# Patient Record
Sex: Male | Born: 1980 | Race: Black or African American | Hispanic: No | Marital: Single | Smoking: Current every day smoker
Health system: Southern US, Community
[De-identification: ages and names within clinical notes are randomized; demographics above are authoritative.]

---

## 2009-02-07 ENCOUNTER — Ambulatory Visit: Payer: Self-pay | Admitting: Diagnostic Radiology

## 2009-02-07 ENCOUNTER — Emergency Department (HOSPITAL_BASED_OUTPATIENT_CLINIC_OR_DEPARTMENT_OTHER): Admission: EM | Admit: 2009-02-07 | Discharge: 2009-02-07 | Payer: Self-pay | Admitting: Emergency Medicine

## 2009-11-06 ENCOUNTER — Emergency Department (HOSPITAL_COMMUNITY): Admission: EM | Admit: 2009-11-06 | Discharge: 2009-11-06 | Payer: Self-pay | Admitting: Emergency Medicine

## 2010-04-05 LAB — POCT I-STAT, CHEM 8
BUN: 9 mg/dL (ref 6–23)
Calcium, Ion: 1.1 mmol/L — ABNORMAL LOW (ref 1.12–1.32)
Chloride: 107 mEq/L (ref 96–112)
Creatinine, Ser: 1.2 mg/dL (ref 0.4–1.5)
Glucose, Bld: 106 mg/dL — ABNORMAL HIGH (ref 70–99)
HCT: 53 % — ABNORMAL HIGH (ref 39.0–52.0)
Hemoglobin: 18 g/dL — ABNORMAL HIGH (ref 13.0–17.0)
Potassium: 4.1 mEq/L (ref 3.5–5.1)
Sodium: 142 mEq/L (ref 135–145)
TCO2: 24 mmol/L (ref 0–100)

## 2010-04-05 LAB — ETHANOL: Alcohol, Ethyl (B): 235 mg/dL — ABNORMAL HIGH (ref 0–10)
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
## 2013-04-19 ENCOUNTER — Encounter (HOSPITAL_BASED_OUTPATIENT_CLINIC_OR_DEPARTMENT_OTHER): Payer: Self-pay | Admitting: Emergency Medicine

## 2013-04-19 ENCOUNTER — Emergency Department (HOSPITAL_BASED_OUTPATIENT_CLINIC_OR_DEPARTMENT_OTHER): Payer: Self-pay

## 2013-04-19 ENCOUNTER — Emergency Department (HOSPITAL_BASED_OUTPATIENT_CLINIC_OR_DEPARTMENT_OTHER)
Admission: EM | Admit: 2013-04-19 | Discharge: 2013-04-19 | Disposition: A | Discharge status: Home / Self Care | Payer: Self-pay | Attending: Emergency Medicine | Admitting: Emergency Medicine

## 2013-04-19 DIAGNOSIS — S3981XA Other specified injuries of abdomen, initial encounter: Secondary | ICD-10-CM | POA: Insufficient documentation

## 2013-04-19 DIAGNOSIS — S0083XA Contusion of other part of head, initial encounter: Secondary | ICD-10-CM

## 2013-04-19 DIAGNOSIS — S20219A Contusion of unspecified front wall of thorax, initial encounter: Secondary | ICD-10-CM | POA: Insufficient documentation

## 2013-04-19 DIAGNOSIS — S1093XA Contusion of unspecified part of neck, initial encounter: Principal | ICD-10-CM

## 2013-04-19 DIAGNOSIS — S0990XA Unspecified injury of head, initial encounter: Secondary | ICD-10-CM | POA: Insufficient documentation

## 2013-04-19 DIAGNOSIS — S0003XA Contusion of scalp, initial encounter: Secondary | ICD-10-CM | POA: Insufficient documentation

## 2013-04-19 DIAGNOSIS — F17200 Nicotine dependence, unspecified, uncomplicated: Secondary | ICD-10-CM | POA: Insufficient documentation

## 2013-04-19 DIAGNOSIS — IMO0002 Reserved for concepts with insufficient information to code with codable children: Secondary | ICD-10-CM | POA: Insufficient documentation

## 2013-04-19 MED ORDER — HYDROCODONE-ACETAMINOPHEN 5-325 MG PO TABS: 1.0000 | ORAL_TABLET | ORAL | Status: AC | PRN
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
## 2013-04-19 NOTE — Discharge Instructions (Signed)
Blunt Chest Trauma Blunt chest trauma is an injury caused by a blow to the chest. These chest injuries can be very painful. Blunt chest trauma often results in bruised or broken (fractured) ribs. Most cases of bruised and fractured ribs from blunt chest traumas get better after 1 to 3 weeks of rest and pain medicine. Often, the soft tissue in the chest wall is also injured, causing pain and bruising. Internal organs, such as the heart and lungs, may also be injured. Blunt chest trauma can lead to serious medical problems. This injury requires immediate medical care. CAUSES   Motor vehicle collisions.  Falls.  Physical violence.  Sports injuries. SYMPTOMS   Chest pain. The pain may be worse when you move or breathe deeply.  Shortness of breath.  Lightheadedness.  Bruising.  Tenderness.  Swelling. DIAGNOSIS  Your caregiver will do a physical exam. X-rays may be taken to look for fractures. However, minor rib fractures may not show up on X-rays until a few days after the injury. If a more serious injury is suspected, further imaging tests may be done. This may include ultrasounds, computed tomography (CT) scans, or magnetic resonance imaging (MRI). TREATMENT  Treatment depends on the severity of your injury. Your caregiver may prescribe pain medicines and deep breathing exercises. HOME CARE INSTRUCTIONS  Limit your activities until you can move around without much pain.  Do not do any strenuous work until your injury is healed.  Put ice on the injured area.  Put ice in a plastic bag.  Place a towel between your skin and the bag.  Leave the ice on for 15-20 minutes, 03-04 times a day.  You may wear a rib belt as directed by your caregiver to reduce pain.  Practice deep breathing as directed by your caregiver to keep your lungs clear.  Only take over-the-counter or prescription medicines for pain, fever, or discomfort as directed by your caregiver. SEEK IMMEDIATE MEDICAL  CARE IF:   You have increasing pain or shortness of breath.  You cough up blood.  You have nausea, vomiting, or abdominal pain.  You have a fever.  You feel dizzy, weak, or you faint. MAKE SURE YOU:  Understand these instructions.  Will watch your condition.  Will get help right away if you are not doing well or get worse. Document Released: 02/16/2004 Document Revised: 04/02/2011 Document Reviewed: 10/25/2010 [facility]ExitCare Patient Information 2014 [place].  Contusion A contusion is a deep bruise. Contusions are the result of an injury that caused bleeding under the skin. The contusion may turn blue, purple, or yellow. Minor injuries will give you a painless contusion, but more severe contusions may stay painful and swollen for a few weeks.  CAUSES  A contusion is usually caused by a blow, trauma, or direct force to an area of the body. SYMPTOMS   Swelling and redness of the injured area.  Bruising of the injured area.  Tenderness and soreness of the injured area.  Pain. DIAGNOSIS  The diagnosis can be made by taking a history and physical exam. An X-ray, CT scan, or MRI may be needed to determine if there were any associated injuries, such as fractures. TREATMENT  Specific treatment will depend on what area of the body was injured. In general, the best treatment for a contusion is resting, icing, elevating, and applying cold compresses to the injured area. Over-the-counter medicines may also be recommended for pain control. Ask your caregiver what the best treatment is for your contusion. HOME  CARE INSTRUCTIONS   Put ice on the injured area.  Put ice in a plastic bag.  Place a towel between your skin and the bag.  Leave the ice on for 15-20 minutes, 03-04 times a day.  Only take over-the-counter or prescription medicines for pain, discomfort, or fever as directed by your caregiver. Your caregiver may recommend avoiding anti-inflammatory medicines (aspirin,  ibuprofen, and naproxen) for 48 hours because these medicines may increase bruising.  Rest the injured area.  If possible, elevate the injured area to reduce swelling. SEEK IMMEDIATE MEDICAL CARE IF:   You have increased bruising or swelling.  You have pain that is getting worse.  Your swelling or pain is not relieved with medicines. MAKE SURE YOU:   Understand these instructions.  Will watch your condition.  Will get help right away if you are not doing well or get worse. Document Released: 10/18/2004 Document Revised: 04/02/2011 Document Reviewed: 11/13/2010 [facility] Patient Information 2014 [place], [place].

## 2022-10-29 ENCOUNTER — Emergency Department (HOSPITAL_BASED_OUTPATIENT_CLINIC_OR_DEPARTMENT_OTHER)
Admission: EM | Admit: 2022-10-29 | Discharge: 2022-10-30 | Disposition: A | Discharge status: Home / Self Care | Payer: Self-pay | Attending: Emergency Medicine | Admitting: Emergency Medicine

## 2022-10-29 ENCOUNTER — Other Ambulatory Visit: Payer: Self-pay

## 2022-10-29 ENCOUNTER — Encounter (HOSPITAL_BASED_OUTPATIENT_CLINIC_OR_DEPARTMENT_OTHER): Payer: Self-pay

## 2022-10-29 ENCOUNTER — Emergency Department (HOSPITAL_BASED_OUTPATIENT_CLINIC_OR_DEPARTMENT_OTHER): Payer: Self-pay

## 2022-10-29 DIAGNOSIS — W19XXXA Unspecified fall, initial encounter: Secondary | ICD-10-CM

## 2022-10-29 DIAGNOSIS — R7989 Other specified abnormal findings of blood chemistry: Secondary | ICD-10-CM | POA: Insufficient documentation

## 2022-10-29 DIAGNOSIS — W010XXA Fall on same level from slipping, tripping and stumbling without subsequent striking against object, initial encounter: Secondary | ICD-10-CM | POA: Insufficient documentation

## 2022-10-29 DIAGNOSIS — S2242XA Multiple fractures of ribs, left side, initial encounter for closed fracture: Secondary | ICD-10-CM | POA: Insufficient documentation

## 2022-10-29 DIAGNOSIS — S62327A Displaced fracture of shaft of fifth metacarpal bone, left hand, initial encounter for closed fracture: Secondary | ICD-10-CM | POA: Insufficient documentation

## 2022-10-29 MED ORDER — HYDROMORPHONE HCL 1 MG/ML IJ SOLN
1.0000 mg | Freq: Once | INTRAMUSCULAR | Status: AC
  Administered 2022-10-29: 1 mg via INTRAVENOUS
  Filled 2022-10-29: qty 1

## 2022-10-29 MED ORDER — LIDOCAINE 5 % EX PTCH
1.0000 | MEDICATED_PATCH | CUTANEOUS | Status: DC
  Administered 2022-10-29: 1 via TRANSDERMAL
  Filled 2022-10-29: qty 1

## 2022-10-29 MED ORDER — OXYCODONE-ACETAMINOPHEN 5-325 MG PO TABS
1.0000 | ORAL_TABLET | Freq: Once | ORAL | Status: AC
  Administered 2022-10-29: 1 via ORAL
  Filled 2022-10-29: qty 1

## 2022-10-29 NOTE — ED Triage Notes (Signed)
Pt to ED by POV from home with c/o L sided rib pain and left sided hand and wrist pain, swelling noted. Pt arrives with a brace he got at the pharmacy for support. Pt states he slipped when getting out of the shower and fell against the tub. Endorses pain while taking a deep breath. Arrives A+O, VSS.

## 2022-10-29 NOTE — ED Provider Notes (Signed)
[facility] Provider Note   CSN: [id number] Arrival date & time: 10/29/22  2026     History {Add pertinent medical, surgical, social history, OB history to HPI:1} Chief Complaint  Patient presents with   Rib Injury   Fall    Stephen Garza is a 42 y.o. male.  The history is provided by the patient.  Fall  Stephen Garza is a 42 y.o. male who presents to the Emergency Department complaining of *** Larey Seat getting out of the shower early Sunday morning.  No head injury, no loc.  Hit wrist/hand.   Rhd Coughing for a few days No fever No medical problems      Home Medications Prior to Admission medications   Medication Sig Start Date End Date Taking? Authorizing Provider  HYDROcodone-acetaminophen (NORCO/VICODIN) 5-325 MG per tablet Take 1-2 tablets by mouth every 4 (four) hours as needed for moderate pain. 04/19/13   Gilda Crease, MD      Allergies    Patient has no known allergies.    Review of Systems   Review of Systems  All other systems reviewed and are negative.   Physical Exam Updated Vital Signs BP 120/81   Pulse 79   Temp 97.8 F (36.6 C)   Resp 20   SpO2 98%  Physical Exam Vitals and nursing note reviewed.  Constitutional:      Appearance: He is well-developed.  HENT:     Head: Normocephalic and atraumatic.  Cardiovascular:     Rate and Rhythm: Normal rate and regular rhythm.     Heart sounds: No murmur heard. Pulmonary:     Effort: Pulmonary effort is normal. No respiratory distress.     Comments: Decreased air movement bilaterally Chest:     Chest wall: Tenderness present.  Abdominal:     Palpations: Abdomen is soft.     Tenderness: There is no guarding or rebound.     Comments: Mild generalized abdominal tenderness  Musculoskeletal:     Comments: 2+ radial pulses bilaterally.  Soft tissue swelling over the left ulnar dorsal hand with local tenderness to palpation.   Sensation to light touch intact throughout the digits.  Able to range wrist and digits.  Unable to extend the left fifth digit.   Skin:    General: Skin is warm and dry.  Neurological:     Mental Status: He is alert and oriented to person, place, and time.  Psychiatric:        Behavior: Behavior normal.     ED Results / Procedures / Treatments   Labs (all labs ordered are listed, but only abnormal results are displayed) Labs Reviewed - No data to display  EKG None  Radiology DG Wrist Complete Left  Result Date: 10/29/2022 CLINICAL DATA:  Fall last night getting out of the bathroom. Left hand and wrist pain. Hand swelling. EXAM: LEFT WRIST - COMPLETE 3+ VIEW; LEFT HAND - COMPLETE 3+ VIEW COMPARISON:  None Available. FINDINGS: Hand: Mildly comminuted, angulated and displaced distal fifth metacarpal fracture. Possible but not definite nondisplaced fracture of the fourth metacarpal shaft. There is a fracture of the fifth distal phalanx involving the dorsal aspect of the articular surface of the proximal interphalangeal joint. Soft tissue edema about the dorsal and ulnar aspect of the hand. Wrist: No additional acute fracture of the wrist. Wrist alignment is maintained. Incidental lunotriquetral coalition, incidental. IMPRESSION: 1. Mildly comminuted, angulated and displaced distal fifth metacarpal fracture. Possible but not definite  nondisplaced fracture of the fourth metacarpal shaft. 2. Fracture of the fifth distal phalanx involving the articular surface of the proximal interphalangeal joint. Electronically Signed   By: Narda Rutherford M.D.   On: 10/29/2022 21:32   DG Hand Complete Left  Result Date: 10/29/2022 CLINICAL DATA:  Fall last night getting out of the bathroom. Left hand and wrist pain. Hand swelling. EXAM: LEFT WRIST - COMPLETE 3+ VIEW; LEFT HAND - COMPLETE 3+ VIEW COMPARISON:  None Available. FINDINGS: Hand: Mildly comminuted, angulated and displaced distal fifth metacarpal  fracture. Possible but not definite nondisplaced fracture of the fourth metacarpal shaft. There is a fracture of the fifth distal phalanx involving the dorsal aspect of the articular surface of the proximal interphalangeal joint. Soft tissue edema about the dorsal and ulnar aspect of the hand. Wrist: No additional acute fracture of the wrist. Wrist alignment is maintained. Incidental lunotriquetral coalition, incidental. IMPRESSION: 1. Mildly comminuted, angulated and displaced distal fifth metacarpal fracture. Possible but not definite nondisplaced fracture of the fourth metacarpal shaft. 2. Fracture of the fifth distal phalanx involving the articular surface of the proximal interphalangeal joint. Electronically Signed   By: Narda Rutherford M.D.   On: 10/29/2022 21:32   DG Ribs Unilateral W/Chest Left  Result Date: 10/29/2022 CLINICAL DATA:  Fall last night getting out of the bathtub. Left rib pain. EXAM: LEFT RIBS AND CHEST - 3+ VIEW COMPARISON:  Chest radiograph 08/27/2014 FINDINGS: Minimally displaced left posterior eighth and nondisplaced posterolateral left 7 rib fractures. Small left pleural effusion and basilar opacity. No pneumothorax. The heart is normal in size, normal cardiomediastinal contours. The right lung is clear. IMPRESSION: Minimally displaced left posterior eighth and nondisplaced posterolateral left seventh rib fractures. Small left pleural effusion and basilar opacity, likely atelectasis. No pneumothorax. Electronically Signed   By: Narda Rutherford M.D.   On: 10/29/2022 21:30    Procedures Procedures  {Document cardiac monitor, telemetry assessment procedure when appropriate:1}  Medications Ordered in ED Medications  oxyCODONE-acetaminophen (PERCOCET/ROXICET) 5-325 MG per tablet 1 tablet (1 tablet Oral Given 10/29/22 2256)    ED Course/ Medical Decision Making/ A&P   {   Click here for ABCD2, HEART and other calculatorsREFRESH Note before signing :1}                               Medical Decision Making Amount and/or Complexity of Data Reviewed Radiology: ordered.   ***  {Document critical care time when appropriate:1} {Document review of labs and clinical decision tools ie heart score, Chads2Vasc2 etc:1}  {Document your independent review of radiology images, and any outside records:1} {Document your discussion with family members, caretakers, and with consultants:1} {Document social determinants of health affecting pt's care:1} {Document your decision making why or why not admission, treatments were needed:1} Final Clinical Impression(s) / ED Diagnoses Final diagnoses:  None    Rx / DC Orders ED Discharge Orders     None

## 2022-10-30 ENCOUNTER — Emergency Department (HOSPITAL_BASED_OUTPATIENT_CLINIC_OR_DEPARTMENT_OTHER): Payer: Self-pay

## 2022-10-30 LAB — CBC WITH DIFFERENTIAL/PLATELET
Abs Immature Granulocytes: 0.05 10*3/uL (ref 0.00–0.07)
Basophils Absolute: 0 10*3/uL (ref 0.0–0.1)
Basophils Relative: 0 %
Eosinophils Absolute: 0.1 10*3/uL (ref 0.0–0.5)
Eosinophils Relative: 2 %
HCT: 36.3 % — ABNORMAL LOW (ref 39.0–52.0)
Hemoglobin: 12.7 g/dL — ABNORMAL LOW (ref 13.0–17.0)
Immature Granulocytes: 1 %
Lymphocytes Relative: 19 %
Lymphs Abs: 1.4 10*3/uL (ref 0.7–4.0)
MCH: 31.1 pg (ref 26.0–34.0)
MCHC: 35 g/dL (ref 30.0–36.0)
MCV: 89 fL (ref 80.0–100.0)
Monocytes Absolute: 0.9 10*3/uL (ref 0.1–1.0)
Monocytes Relative: 12 %
Neutro Abs: 5 10*3/uL (ref 1.7–7.7)
Neutrophils Relative %: 66 %
Platelets: 163 10*3/uL (ref 150–400)
RBC: 4.08 MIL/uL — ABNORMAL LOW (ref 4.22–5.81)
RDW: 13.8 % (ref 11.5–15.5)
WBC: 7.4 10*3/uL (ref 4.0–10.5)
nRBC: 0 % (ref 0.0–0.2)

## 2022-10-30 LAB — COMPREHENSIVE METABOLIC PANEL
ALT: 61 U/L — ABNORMAL HIGH (ref 0–44)
AST: 87 U/L — ABNORMAL HIGH (ref 15–41)
Albumin: 3.6 g/dL (ref 3.5–5.0)
Alkaline Phosphatase: 44 U/L (ref 38–126)
Anion gap: 11 (ref 5–15)
BUN: 10 mg/dL (ref 6–20)
CO2: 26 mmol/L (ref 22–32)
Calcium: 8.9 mg/dL (ref 8.9–10.3)
Chloride: 99 mmol/L (ref 98–111)
Creatinine, Ser: 1 mg/dL (ref 0.61–1.24)
GFR, Estimated: >60 mL/min (ref >60)
Glucose, Bld: 97 mg/dL (ref 70–99)
Potassium: 3.8 mmol/L (ref 3.5–5.1)
Sodium: 136 mmol/L (ref 135–145)
Total Bilirubin: 0.7 mg/dL (ref 0.3–1.2)
Total Protein: 6.3 g/dL — ABNORMAL LOW (ref 6.5–8.1)

## 2022-10-30 MED ORDER — OXYCODONE-ACETAMINOPHEN 5-325 MG PO TABS
1.0000 | ORAL_TABLET | Freq: Four times a day (QID) | ORAL | 0 refills | Status: DC | PRN
Start: 2022-10-30 — End: 2022-11-02

## 2022-10-30 MED ORDER — IOHEXOL 300 MG/ML  SOLN
100.0000 mL | Freq: Once | INTRAMUSCULAR | Status: AC | PRN
  Administered 2022-10-30: 100 mL via INTRAVENOUS

## 2022-10-30 MED ORDER — KETOROLAC TROMETHAMINE 15 MG/ML IJ SOLN
15.0000 mg | Freq: Once | INTRAMUSCULAR | Status: AC
  Administered 2022-10-30: 15 mg via INTRAVENOUS
  Filled 2022-10-30: qty 1

## 2022-10-30 MED ORDER — ONDANSETRON HCL 4 MG/2ML IJ SOLN
4.0000 mg | Freq: Once | INTRAMUSCULAR | Status: AC
  Administered 2022-10-30: 4 mg via INTRAVENOUS
  Filled 2022-10-30: qty 2

## 2022-10-30 MED ORDER — HYDROMORPHONE HCL 1 MG/ML IJ SOLN
1.0000 mg | Freq: Once | INTRAMUSCULAR | Status: AC
  Administered 2022-10-30: 1 mg via INTRAVENOUS
  Filled 2022-10-30: qty 1

## 2022-10-30 MED ORDER — IBUPROFEN 600 MG PO TABS: 600.0000 mg | ORAL_TABLET | Freq: Four times a day (QID) | ORAL | 0 refills | Status: AC | PRN

## 2022-10-30 NOTE — Patient Instructions (Signed)
Instructed patient on the proper use of an IS. Patient able to demonstrate x 2 to achieve . Patient complained of moderate pain while demonstrating. Patent instructed to use IS x 10 WA. Patient understood and tolerated ok.

## 2022-11-01 ENCOUNTER — Emergency Department (HOSPITAL_BASED_OUTPATIENT_CLINIC_OR_DEPARTMENT_OTHER): Payer: Self-pay

## 2022-11-01 ENCOUNTER — Encounter (HOSPITAL_BASED_OUTPATIENT_CLINIC_OR_DEPARTMENT_OTHER): Payer: Self-pay

## 2022-11-01 ENCOUNTER — Emergency Department (HOSPITAL_BASED_OUTPATIENT_CLINIC_OR_DEPARTMENT_OTHER)
Admission: EM | Admit: 2022-11-01 | Discharge: 2022-11-02 | Disposition: A | Discharge status: Home / Self Care | Payer: Self-pay | Attending: Emergency Medicine | Admitting: Emergency Medicine

## 2022-11-01 DIAGNOSIS — S2242XD Multiple fractures of ribs, left side, subsequent encounter for fracture with routine healing: Secondary | ICD-10-CM

## 2022-11-01 DIAGNOSIS — W19XXXA Unspecified fall, initial encounter: Secondary | ICD-10-CM | POA: Insufficient documentation

## 2022-11-01 DIAGNOSIS — S2242XA Multiple fractures of ribs, left side, initial encounter for closed fracture: Secondary | ICD-10-CM | POA: Insufficient documentation

## 2022-11-01 NOTE — ED Notes (Signed)
Patient transported to X-ray 

## 2022-11-01 NOTE — ED Triage Notes (Addendum)
Pt was seen here 10/7 for a fall, states he has fx ribs.  Came back today for increased pain. States he is having difficulty breathing at times Pain meds prescribed "not helping"

## 2022-11-02 MED ORDER — LIDOCAINE 5 % EX PTCH: 1.0000 | MEDICATED_PATCH | CUTANEOUS | 0 refills | Status: AC

## 2022-11-02 MED ORDER — OXYCODONE-ACETAMINOPHEN 7.5-325 MG PO TABS: 1.0000 | ORAL_TABLET | ORAL | 0 refills | Status: AC | PRN

## 2022-11-02 MED ORDER — MORPHINE SULFATE (PF) 4 MG/ML IV SOLN
4.0000 mg | Freq: Once | INTRAVENOUS | Status: AC
  Administered 2022-11-02: 4 mg via INTRAMUSCULAR
  Filled 2022-11-02: qty 1

## 2022-11-02 MED ORDER — GABAPENTIN 100 MG PO CAPS: 100.0000 mg | ORAL_CAPSULE | Freq: Three times a day (TID) | ORAL | 0 refills | Status: AC | PRN

## 2022-11-02 MED ORDER — KETOROLAC TROMETHAMINE 60 MG/2ML IM SOLN
60.0000 mg | Freq: Once | INTRAMUSCULAR | Status: AC
  Administered 2022-11-02: 60 mg via INTRAMUSCULAR
  Filled 2022-11-02: qty 2

## 2022-11-02 NOTE — ED Provider Notes (Signed)
[place] [facility] AT [facility] Provider Note   CSN: [id number] Arrival date & time: 11/01/22  2325     History  Chief Complaint  Patient presents with   Rib Injury    Stephen Garza is a 42 y.o. male.  The history is provided by the patient.  Stephen Garza is a 42 y.o. male who presents to the Emergency Department complaining of rib pain.  He presents to the emergency department for evaluation of pain in the left side of his chest where he had rib fractures secondary to a fall several days ago.  He was discharged home on ibuprofen and Percocet.  He presents today due to persistent pain despite taking pain medications.  He does have a cough.  He has pain on breathing but is not having labored breathing.  He also reports pain in his chest with urinating as well as pain in his chest when having a bowel movement. No fevers.     Home Medications Prior to Admission medications   Medication Sig Start Date End Date Taking? Authorizing Provider  gabapentin (NEURONTIN) 100 MG capsule Take 1 capsule (100 mg total) by mouth 3 (three) times daily as needed. 11/02/22  Yes Tilden Fossa, MD  lidocaine (LIDODERM) 5 % Place 1 patch onto the skin daily. Remove & Discard patch within 12 hours or as directed by MD 11/02/22  Yes Tilden Fossa, MD  oxyCODONE-acetaminophen (PERCOCET) 7.5-325 MG tablet Take 1 tablet by mouth every 4 (four) hours as needed for severe pain. 11/02/22  Yes Tilden Fossa, MD  HYDROcodone-acetaminophen (NORCO/VICODIN) 5-325 MG per tablet Take 1-2 tablets by mouth every 4 (four) hours as needed for moderate pain. 04/19/13   Gilda Crease, MD  ibuprofen (ADVIL) 600 MG tablet Take 1 tablet (600 mg total) by mouth every 6 (six) hours as needed. 10/30/22   Tilden Fossa, MD      Allergies    Patient has no known allergies.    Review of Systems   Review of Systems  All other systems reviewed and are negative.   Physical  Exam Updated Vital Signs BP 118/82   Pulse 70   Temp 98.2 F (36.8 C) (Oral)   Resp 20   Ht 5\' 5"  (1.651 m)   Wt 56.7 kg   SpO2 99%   BMI 20.80 kg/m  Physical Exam Vitals and nursing note reviewed.  Constitutional:      Appearance: He is well-developed.  HENT:     Head: Normocephalic and atraumatic.  Cardiovascular:     Rate and Rhythm: Normal rate and regular rhythm.     Heart sounds: No murmur heard. Pulmonary:     Effort: Pulmonary effort is normal. No respiratory distress.     Breath sounds: Normal breath sounds.  Chest:     Chest wall: Tenderness present.  Abdominal:     Palpations: Abdomen is soft.     Tenderness: There is no guarding or rebound.  Musculoskeletal:        General: No tenderness.     Comments: Splint to the left upper extremity that is clean, dry, intact  Skin:    General: Skin is warm and dry.  Neurological:     Mental Status: He is alert and oriented to person, place, and time.  Psychiatric:        Behavior: Behavior normal.     ED Results / Procedures / Treatments   Labs (all labs ordered are listed, but only abnormal results are  displayed) Labs Reviewed - No data to display  EKG None  Radiology DG Chest 2 View  Result Date: 11/02/2022 CLINICAL DATA:  Chest pain, dyspnea, rib fracture EXAM: CHEST - 2 VIEW COMPARISON:  10/29/2022 FINDINGS: Acute fractures of the left seventh and eighth ribs are again noted. Small left pleural effusion is stable. Associated mild left basilar atelectasis. Right lung is clear. No pneumothorax. No pleural effusion on the right. Cardiac size within normal limits. Pulmonary vascularity is normal. IMPRESSION: 1. Stable left rib fractures. Stable small left pleural effusion. No pneumothorax. Electronically Signed   By: Helyn Numbers M.D.   On: 11/02/2022 00:55    Procedures Procedures    Medications Ordered in ED Medications  ketorolac (TORADOL) injection 60 mg (60 mg Intramuscular Given 11/02/22 0037)   morphine (PF) 4 MG/ML injection 4 mg (4 mg Intramuscular Given 11/02/22 0037)    ED Course/ Medical Decision Making/ A&P                                 Medical Decision Making Amount and/or Complexity of Data Reviewed Radiology: ordered.  Risk Prescription drug management.   Patient here for left sided chest pain, recently had a fall and had left-sided rib fractures.  He Leander Rams presents for evaluation today due to continued pain despite taking home medications.  No evidence of secondary infection at this time.  He does not have significant splinting on examination but does have pain on coughing, repositioning.  Lungs are clear bilaterally without respiratory distress.  Plain film is negative for acute infiltrate, demonstrates stable effusion and fractures-images personally reviewed and interpreted, agree with radiologist interpretation.  Discussed with patient home care for rib fractures.  Will increase his Percocet to 7.5 mg oxycodone.  Discussed continuing his ibuprofen.  Will add a Lidoderm patch and gabapentin.  Discussed importance of establishing PCP for follow-up-phone numbers provided.  Also discussed that he will need to follow-up with hand surgery as earlier recommended.        Final Clinical Impression(s) / ED Diagnoses Final diagnoses:  Closed fracture of multiple ribs of left side with routine healing, subsequent encounter    Rx / DC Orders ED Discharge Orders          Ordered    oxyCODONE-acetaminophen (PERCOCET) 7.5-325 MG tablet  Every 4 hours PRN        11/02/22 0125    lidocaine (LIDODERM) 5 %  Every 24 hours        11/02/22 0125    gabapentin (NEURONTIN) 100 MG capsule  3 times daily PRN        11/02/22 0125              Tilden Fossa, MD 11/02/22 0236
